# Patient Record
Sex: Female | Born: 1985 | Race: White | Hispanic: No | Marital: Single | Smoking: Never smoker
Health system: Southern US, Community
[De-identification: ages and names within clinical notes are randomized; demographics above are authoritative.]

## PROBLEM LIST (undated history)

## (undated) ENCOUNTER — Inpatient Hospital Stay (HOSPITAL_COMMUNITY): Payer: Self-pay

## (undated) DIAGNOSIS — N390 Urinary tract infection, site not specified: Secondary | ICD-10-CM

## (undated) DIAGNOSIS — Z8619 Personal history of other infectious and parasitic diseases: Secondary | ICD-10-CM

## (undated) DIAGNOSIS — IMO0002 Reserved for concepts with insufficient information to code with codable children: Secondary | ICD-10-CM

## (undated) HISTORY — PX: FOOT SURGERY: SHX648

## (undated) HISTORY — PX: LEEP: SHX91

## (undated) HISTORY — DX: Personal history of other infectious and parasitic diseases: Z86.19

## (undated) HISTORY — PX: COLPOSCOPY: SHX161

---

## 2004-01-22 ENCOUNTER — Other Ambulatory Visit: Admission: RE | Admit: 2004-01-22 | Discharge: 2004-01-22 | Payer: Self-pay | Admitting: Obstetrics and Gynecology

## 2004-10-17 ENCOUNTER — Other Ambulatory Visit: Admission: RE | Admit: 2004-10-17 | Discharge: 2004-10-17 | Payer: Self-pay | Admitting: Obstetrics and Gynecology

## 2005-10-20 ENCOUNTER — Other Ambulatory Visit: Admission: RE | Admit: 2005-10-20 | Discharge: 2005-10-20 | Payer: Self-pay | Admitting: Obstetrics and Gynecology

## 2007-09-16 ENCOUNTER — Ambulatory Visit (HOSPITAL_BASED_OUTPATIENT_CLINIC_OR_DEPARTMENT_OTHER): Admission: RE | Admit: 2007-09-16 | Discharge: 2007-09-16 | Payer: Self-pay | Admitting: Orthopedic Surgery

## 2010-12-24 NOTE — Op Note (Signed)
Victoria Bowman, Victoria Bowman NO.:  [id number]   MEDICAL RECORD NO.:  [id number]          PATIENT TYPE:  AMB   LOCATION:  DSC                          FACILITY:  [facility]   PHYSICIAN:  Loreta Ave, M.D. DATE OF BIRTH:  01-07-1986   DATE OF PROCEDURE:  09/16/2007  DATE OF DISCHARGE:                               OPERATIVE REPORT   PREOPERATIVE DIAGNOSIS:  Displaced shortness spiral fracture fifth  metacarpal left hand, closed.   POSTOPERATIVE DIAGNOSIS:  Displaced shortness spiral fracture fifth  metacarpal left hand, closed.   PROCEDURE:  Open reduction and internal fixation of fifth metacarpal  fracture with a five hole dorsal mini-fragment Synthes plate and 1.5 mm  screws.   SURGEON:  Loreta Ave, M.D.   ASSISTANT:  Genene Churn. Barry Dienes, P.A.-C.   ANESTHESIA:  General   BLOOD LOSS:  Minimal.   TOURNIQUET TIME:  35 minutes.   SPECIMENS:  None.   CULTURES:  None.   COMPLICATIONS:  None.   DRESSING:  Soft compressive and a splint.   PROCEDURE IN DETAIL:  The patient was brought to the operating room and  after adequate anesthesia had been obtained, the splint removed.  Tourniquet applied.  Prepped and draped in the usual sterile fashion.  Exsanguinated with elevation of Esmarch, tourniquet inflated to 250  mmHg.  A longitudinal incision over the dorsal ulnar aspect of the fifth  metacarpal.  Fluoroscopic guidance throughout.  Skin and subcutaneous  tissue divided.  Neurovascular structures and extensor tendons  retracted. Subperiosteal exposure of the fracture.  Debris cleared from  the fracture, then reduced anatomically, held with a clamp.  It was then  affixed with a dorsal five hole plate.  Two proximal and two distal  screws and then the middle screw through the plate and across the  fracture but overdrilled to produce lag effect across the spiral part of  the fracture.  At completion, anatomic alignment, solid stable fixation  confirmed visually  and fluoroscopically.  Some of the screw had a little  prominent bone, I tried to go to a slightly shorter screw, I could not  get the opposite cortex set.  Rotation normal. Length normal.  Wound  irrigated.  Closed with  nylon.  Margins of the wound injected with Marcaine.  Sterile  compressive dressing and ulnar gutter splint applied.  Tourniquet was  removed.  Anesthesia reversed.  Brought to the recovery room. Tolerated  the surgery well.  No complications.      Loreta Ave, M.D.  Electronically Signed     DFM/MEDQ  D:  09/16/2007  T:  09/16/2007  Job:  [id number]

## 2011-05-02 LAB — POCT HEMOGLOBIN-HEMACUE: Hemoglobin: 12.5

## 2012-06-28 LAB — OB RESULTS CONSOLE ABO/RH: RH Type: POSITIVE

## 2012-06-28 LAB — OB RESULTS CONSOLE ANTIBODY SCREEN: Antibody Screen: NEGATIVE

## 2012-06-28 LAB — OB RESULTS CONSOLE RPR: RPR: NONREACTIVE

## 2012-06-28 LAB — OB RESULTS CONSOLE GC/CHLAMYDIA: Chlamydia: NEGATIVE

## 2012-06-28 LAB — OB RESULTS CONSOLE RUBELLA ANTIBODY, IGM: Rubella: IMMUNE

## 2012-06-28 LAB — OB RESULTS CONSOLE HIV ANTIBODY (ROUTINE TESTING): HIV: NONREACTIVE

## 2012-06-28 LAB — OB RESULTS CONSOLE HEPATITIS B SURFACE ANTIGEN: Hepatitis B Surface Ag: NEGATIVE

## 2012-07-22 ENCOUNTER — Encounter (HOSPITAL_COMMUNITY): Payer: Self-pay

## 2012-07-22 ENCOUNTER — Inpatient Hospital Stay (HOSPITAL_COMMUNITY)
Admission: AD | Admit: 2012-07-22 | Discharge: 2012-07-22 | Disposition: A | Discharge status: Home / Self Care | Payer: BC Managed Care – PPO | Source: Ambulatory Visit | Attending: Obstetrics and Gynecology | Admitting: Obstetrics and Gynecology

## 2012-07-22 ENCOUNTER — Inpatient Hospital Stay (HOSPITAL_COMMUNITY): Payer: BC Managed Care – PPO

## 2012-07-22 DIAGNOSIS — O209 Hemorrhage in early pregnancy, unspecified: Secondary | ICD-10-CM

## 2012-07-22 HISTORY — DX: Urinary tract infection, site not specified: N39.0

## 2012-07-22 HISTORY — DX: Reserved for concepts with insufficient information to code with codable children: IMO0002

## 2012-07-22 LAB — CBC
HCT: 33 % — ABNORMAL LOW (ref 36.0–46.0)
Hemoglobin: 11.3 g/dL — ABNORMAL LOW (ref 12.0–15.0)
MCV: 88.9 fL (ref 78.0–100.0)
RBC: 3.71 MIL/uL — ABNORMAL LOW (ref 3.87–5.11)
RDW: 12.8 % (ref 11.5–15.5)
WBC: 9.6 10*3/uL (ref 4.0–10.5)

## 2012-07-22 NOTE — MAU Provider Note (Signed)
  History     CSN: [id number]  Arrival date and time: 07/22/12 1537   First Provider Initiated Contact with Patient 07/22/12 1611      Chief Complaint  Patient presents with  . Vaginal Bleeding   HPI  Pt is G1P0 [redacted]w[redacted]d pregnant who present with bright red bleeding in pregnancy.  Pt has a hx of early [facility] on earlier ultrasound in the office.  Pt denies cramping or other symptoms.  She denies clots or passage of tissue.  Past Medical History  Diagnosis Date  . UTI (lower urinary tract infection)   . Abnormal Pap smear     Past Surgical History  Procedure Date  . Foot surgery   . Colposcopy   . Leep     History reviewed. No pertinent family history.  History  Substance Use Topics  . Smoking status: Never Smoker   . Smokeless tobacco: Not on file  . Alcohol Use: No    Allergies: Allergies not on file  No prescriptions prior to admission    Review of Systems  Constitutional: Negative for fever and chills.  Gastrointestinal: Negative for nausea, vomiting, abdominal pain, diarrhea and constipation.  Genitourinary: Negative for dysuria.   Physical Exam   Blood pressure 108/70, pulse 83, temperature 97.2 F (36.2 C), temperature source Oral, resp. rate 16, height 5\' 2"  (1.575 m), weight 126 lb 8 oz (57.38 kg), last menstrual period 05/05/2012.  Physical Exam  Vitals reviewed. Constitutional: She is oriented to person, place, and time. She appears well-developed and well-nourished.  HENT:  Head: Normocephalic.  Eyes: Conjunctivae normal are normal. Pupils are equal, round, and reactive to light.  Neck: Normal range of motion. Neck supple.  Cardiovascular: Normal rate.   Respiratory: Effort normal.  GI: Soft. She exhibits no distension. There is no tenderness. There is no rebound and no guarding.       FHR noted with doppler  Genitourinary:       Mod amount of bright red blood from os- several small clots in vault; cervix closed; bimanual deferred    Musculoskeletal: Normal range of motion.  Neurological: She is alert and oriented to person, place, and time.  Skin: Skin is warm and dry.  Psychiatric: She has a normal mood and affect.    MAU Course  Procedures   Results for orders placed during the hospital encounter of 07/22/12 (from the past 24 hour(s))  CBC     Status: Abnormal   Collection Time   07/22/12  4:23 PM      Component Value Range   WBC 9.6  4.0 - 10.5 K/uL   RBC 3.71 (*) 3.87 - 5.11 MIL/uL   Hemoglobin 11.3 (*) 12.0 - 15.0 g/dL   HCT 08.6 (*) 57.8 - 46.9 %   MCV 88.9  78.0 - 100.0 fL   MCH 30.5  26.0 - 34.0 pg   MCHC 34.2  30.0 - 36.0 g/dL   RDW 62.9  52.8 - 41.3 %   Platelets 254  150 - 400 K/uL  ABO/RH     Status: Normal   Collection Time   07/22/12  4:23 PM      Component Value Range   ABO/RH(D) O POS     Discussed with Dr. Renaldo Fiddler Assessment and Plan  Bleeding in pregnancy Pelvic rest F/u with Dr. Gloriajean Dell 07/22/2012, 4:21 PM

## 2012-07-22 NOTE — MAU Note (Signed)
Pt states was seen at Dr. Lisbeth Ply office, was told spotting was normal, however today began bleeding like a menstrual cycle 30 minutes ago. Pt tearful in triage room. Taken directly to room

## 2012-07-22 NOTE — MAU Note (Signed)
Patient is in with c/o new onset of vaginal bleeding. She didn't have a pad. New pad was given and a streak of blood is on pad. She denies any pain. Patient states that early was done to confirm about 2 weeks ago.

## 2012-08-11 NOTE — L&D Delivery Note (Signed)
Delivery Note At 3:31 PM a viable female was delivered via Vaginal, Spontaneous Delivery (Presentation: Right Occiput Anterior).  APGAR: 8, 9; weight .   Placenta status: Intact, Spontaneous.  Cord: 3 vessels with the following complications: None.  Cord pH: notn sent   Tight nuchat cord X 1, clamped and cut  Anesthesia: Epidural  Episiotomy: Median Lacerations: 3rd degree;Perineal>>>partial>>>rectum intact  2-0 Vicryl fig 8 on sphincter mm and capsule   Suture Repair: 3.0 vicryl rapide Est. Blood Loss (mL)400:  Mom to postpartum.  Baby to nursery-stable.  Meriel Pica 01/31/2013, 3:53 PM

## 2012-10-16 ENCOUNTER — Encounter (HOSPITAL_COMMUNITY): Payer: Self-pay

## 2012-10-16 ENCOUNTER — Inpatient Hospital Stay (HOSPITAL_COMMUNITY)
Admission: AD | Admit: 2012-10-16 | Discharge: 2012-10-16 | Disposition: A | Discharge status: Home / Self Care | Payer: BC Managed Care – PPO | Source: Ambulatory Visit | Attending: Obstetrics and Gynecology | Admitting: Obstetrics and Gynecology

## 2012-10-16 DIAGNOSIS — M549 Dorsalgia, unspecified: Secondary | ICD-10-CM | POA: Insufficient documentation

## 2012-10-16 DIAGNOSIS — O99891 Other specified diseases and conditions complicating pregnancy: Secondary | ICD-10-CM | POA: Insufficient documentation

## 2012-10-16 LAB — URINALYSIS, ROUTINE W REFLEX MICROSCOPIC
Glucose, UA: NEGATIVE mg/dL
Hgb urine dipstick: NEGATIVE
Specific Gravity, Urine: 1.025 (ref 1.005–1.030)
pH: 6 (ref 5.0–8.0)

## 2012-10-16 LAB — URINE MICROSCOPIC-ADD ON

## 2012-10-16 MED ORDER — ACETAMINOPHEN 325 MG PO TABS
650.0000 mg | ORAL_TABLET | Freq: Once | ORAL | Status: AC
  Administered 2012-10-16: 650 mg via ORAL
  Filled 2012-10-16: qty 2

## 2012-10-16 MED ORDER — CEPHALEXIN 500 MG PO CAPS
500.0000 mg | ORAL_CAPSULE | Freq: Once | ORAL | Status: AC
  Administered 2012-10-16: 500 mg via ORAL
  Filled 2012-10-16: qty 1

## 2012-10-16 MED ORDER — CEPHALEXIN 500 MG PO CAPS: 500.0000 mg | ORAL_CAPSULE | Freq: Three times a day (TID) | ORAL | Status: DC

## 2012-10-16 NOTE — MAU Note (Signed)
Back pain all week but worse tonight. Pain woke me up. Started lower back and now R flank area. Consistent dull pain with occ sharp pain.

## 2012-10-16 NOTE — MAU Note (Signed)
Pt states having lower back for a couple weeks, worsening last pm.  States it has eased up at the present time. Back pain did radiate up R side of back. Denies uc pain, vag. Bleeding, or discharge.

## 2012-10-16 NOTE — MAU Provider Note (Signed)
History     CSN: [id number]  Arrival date and time: 10/16/12 0218   None     Chief Complaint  Patient presents with  . Back Pain   HPI Victoria Bowman is a 27 y.o. female @ [redacted]w[redacted]d gestation who presents to MAU with back pain. The pain started a couple weeks ago and has been off and on but tonight the pain woke her and was worse. The pain is located in the right flank area. She rates the pain as 6/10. She describes the pain as sharp that comes and goes. Nothing makes the pain better or worse. Denies UTI symptoms. Had nausea and vomiting 2 days ago but none since then except when pain gets bad fees nausea. The history was provided by the patient.  OB History   Grav Para Term Preterm Abortions TAB SAB Ect Mult Living   1 0 0 0 0 0 0 0 0 0       Past Medical History  Diagnosis Date  . UTI (lower urinary tract infection)   . Abnormal Pap smear     Past Surgical History  Procedure Laterality Date  . Foot surgery    . Colposcopy    . Leep      History reviewed. No pertinent family history.  History  Substance Use Topics  . Smoking status: Never Smoker   . Smokeless tobacco: Not on file  . Alcohol Use: No    Allergies:  Allergies  Allergen Reactions  . Sulfa Antibiotics Hives, Itching and Swelling    Prescriptions prior to admission  Medication Sig Dispense Refill  . Prenatal Vit-Fe Fumarate-FA (PRENATAL MULTIVITAMIN) TABS Take 1 tablet by mouth daily.        Review of Systems  Constitutional: Negative for fever, chills and weight loss.  HENT: Negative for ear pain, nosebleeds, congestion, sore throat and neck pain.   Eyes: Negative for blurred vision, double vision, photophobia and pain.  Respiratory: Negative for cough, shortness of breath and wheezing.   Cardiovascular: Negative for chest pain, palpitations and leg swelling.  Gastrointestinal: Positive for nausea. Negative for heartburn, vomiting, abdominal pain, diarrhea and constipation.  Genitourinary:  Negative for dysuria, urgency and frequency.  Musculoskeletal: Positive for back pain. Negative for myalgias.  Skin: Negative for itching and rash.  Neurological: Negative for dizziness, sensory change, speech change, seizures and headaches.  Endo/Heme/Allergies: Does not bruise/bleed easily.  Psychiatric/Behavioral: Negative for depression. The patient is not nervous/anxious.    Blood pressure 120/80, pulse 87, temperature 98.6 F (37 C), resp. rate 20, height 5' 3.5" (1.613 m), weight 135 lb 9.6 oz (61.508 kg), last menstrual period 05/05/2012.  Physical Exam  Nursing note and vitals reviewed. Constitutional: She is oriented to person, place, and time. She appears well-developed and well-nourished. No distress.  HENT:  Head: Normocephalic and atraumatic.  Eyes: EOM are normal.  Neck: Neck supple.  Cardiovascular: Normal rate and regular rhythm.   Respiratory: Effort normal and breath sounds normal. No respiratory distress.  GI: Soft. There is no tenderness.  Musculoskeletal: Normal range of motion.  Mildly tender with palpation right thoracic area.    Neurological: She is alert and oriented to person, place, and time.  Skin: Skin is warm and dry.  Psychiatric: She has a normal mood and affect. Her behavior is normal. Judgment and thought content normal.   EFM: baseline 150, no contractions Procedures Results for orders placed during the hospital encounter of 10/16/12 (from the past 24 hour(s))  URINALYSIS, ROUTINE W  REFLEX MICROSCOPIC     Status: Abnormal   Collection Time    10/16/12  2:30 AM      Result Value Range   Color, Urine YELLOW  YELLOW   APPearance CLEAR  CLEAR   Specific Gravity, Urine 1.025  1.005 - 1.030   pH 6.0  5.0 - 8.0   Glucose, UA NEGATIVE  NEGATIVE mg/dL   Hgb urine dipstick NEGATIVE  NEGATIVE   Bilirubin Urine NEGATIVE  NEGATIVE   Ketones, ur NEGATIVE  NEGATIVE mg/dL   Protein, ur NEGATIVE  NEGATIVE mg/dL   Urobilinogen, UA 0.2  0.0 - 1.0 mg/dL    Nitrite NEGATIVE  NEGATIVE   Leukocytes, UA TRACE (*) NEGATIVE  URINE MICROSCOPIC-ADD ON     Status: Abnormal   Collection Time    10/16/12  2:30 AM      Result Value Range   Squamous Epithelial / LPF RARE  RARE   WBC, UA 3-6  <3 WBC/hpf   RBC / HPF 3-6  <3 RBC/hpf   Bacteria, UA FEW (*) RARE    I discussed this case with Dr. Rana Snare  Assessment: 27 y.o. female @ [redacted]w[redacted]d with back pain   Possible early UTI  Plan:  Tylenol, Rx Keflex, culture urine, follow up in the office Discussed with the patient and all questioned fully answered. She willreturn if any problems arise.     Medication List    TAKE these medications       cephALEXin 500 MG capsule  Commonly known as:  KEFLEX  Take 1 capsule (500 mg total) by mouth 3 (three) times daily.     prenatal multivitamin Tabs  Take 1 tablet by mouth daily.        NEESE,HOPE, RN, FNP, [facility] 10/16/2012, 3:01 AM

## 2012-10-17 LAB — URINE CULTURE
Colony Count: NO GROWTH
Culture: NO GROWTH

## 2012-11-15 ENCOUNTER — Inpatient Hospital Stay (HOSPITAL_COMMUNITY)
Admission: AD | Admit: 2012-11-15 | Discharge: 2012-11-17 | DRG: 886 | Disposition: A | Discharge status: Home / Self Care | Payer: BC Managed Care – PPO | Source: Ambulatory Visit | Attending: Obstetrics and Gynecology | Admitting: Obstetrics and Gynecology

## 2012-11-15 ENCOUNTER — Encounter (HOSPITAL_COMMUNITY): Payer: Self-pay | Admitting: *Deleted

## 2012-11-15 ENCOUNTER — Inpatient Hospital Stay (HOSPITAL_COMMUNITY): Payer: BC Managed Care – PPO

## 2012-11-15 DIAGNOSIS — O26839 Pregnancy related renal disease, unspecified trimester: Secondary | ICD-10-CM | POA: Diagnosis present

## 2012-11-15 DIAGNOSIS — R1032 Left lower quadrant pain: Secondary | ICD-10-CM | POA: Diagnosis present

## 2012-11-15 DIAGNOSIS — N12 Tubulo-interstitial nephritis, not specified as acute or chronic: Secondary | ICD-10-CM | POA: Diagnosis present

## 2012-11-15 DIAGNOSIS — N1330 Unspecified hydronephrosis: Secondary | ICD-10-CM | POA: Diagnosis present

## 2012-11-15 DIAGNOSIS — O2390 Unspecified genitourinary tract infection in pregnancy, unspecified trimester: Principal | ICD-10-CM | POA: Diagnosis present

## 2012-11-15 DIAGNOSIS — N200 Calculus of kidney: Secondary | ICD-10-CM | POA: Diagnosis present

## 2012-11-15 LAB — COMPREHENSIVE METABOLIC PANEL
AST: 19 U/L (ref 0–37)
Albumin: 2.7 g/dL — ABNORMAL LOW (ref 3.5–5.2)
BUN: 16 mg/dL (ref 6–23)
Calcium: 9.3 mg/dL (ref 8.4–10.5)
Creatinine, Ser: 0.61 mg/dL (ref 0.50–1.10)
Total Protein: 7 g/dL (ref 6.0–8.3)

## 2012-11-15 LAB — URINALYSIS, ROUTINE W REFLEX MICROSCOPIC
Glucose, UA: NEGATIVE mg/dL
Ketones, ur: NEGATIVE mg/dL
Protein, ur: NEGATIVE mg/dL
Urobilinogen, UA: 0.2 mg/dL (ref 0.0–1.0)

## 2012-11-15 LAB — URINE MICROSCOPIC-ADD ON

## 2012-11-15 LAB — CBC
HCT: 33.6 % — ABNORMAL LOW (ref 36.0–46.0)
MCHC: 33 g/dL (ref 30.0–36.0)
MCV: 92.8 fL (ref 78.0–100.0)
Platelets: 269 10*3/uL (ref 150–400)
RDW: 13.2 % (ref 11.5–15.5)

## 2012-11-15 MED ORDER — BUTORPHANOL TARTRATE 1 MG/ML IJ SOLN
1.0000 mg | Freq: Once | INTRAMUSCULAR | Status: AC
  Administered 2012-11-15: 1 mg via INTRAVENOUS
  Filled 2012-11-15: qty 1

## 2012-11-15 MED ORDER — ZOLPIDEM TARTRATE 5 MG PO TABS: 5.0000 mg | ORAL_TABLET | Freq: Every evening | ORAL | Status: DC | PRN

## 2012-11-15 MED ORDER — CALCIUM CARBONATE ANTACID 500 MG PO CHEW
2.0000 | CHEWABLE_TABLET | ORAL | Status: DC | PRN
  Filled 2012-11-15: qty 2

## 2012-11-15 MED ORDER — ACETAMINOPHEN 325 MG PO TABS: 650.0000 mg | ORAL_TABLET | ORAL | Status: DC | PRN

## 2012-11-15 MED ORDER — LACTATED RINGERS IV SOLN
INTRAVENOUS | Status: DC
  Administered 2012-11-15 – 2012-11-17 (×6): via INTRAVENOUS

## 2012-11-15 MED ORDER — PROMETHAZINE HCL 25 MG/ML IJ SOLN
12.5000 mg | INTRAMUSCULAR | Status: DC | PRN
Start: 2012-11-15 — End: 2012-11-17
  Administered 2012-11-15: 12.5 mg via INTRAVENOUS
  Filled 2012-11-15: qty 1

## 2012-11-15 MED ORDER — LACTATED RINGERS IV BOLUS (SEPSIS)
1000.0000 mL | Freq: Once | INTRAVENOUS | Status: AC
  Administered 2012-11-15: 1000 mL via INTRAVENOUS

## 2012-11-15 MED ORDER — DOCUSATE SODIUM 100 MG PO CAPS
100.0000 mg | ORAL_CAPSULE | Freq: Every day | ORAL | Status: DC
  Administered 2012-11-15 – 2012-11-16 (×2): 100 mg via ORAL
  Filled 2012-11-15 (×2): qty 1

## 2012-11-15 MED ORDER — CEFAZOLIN SODIUM-DEXTROSE 2-3 GM-% IV SOLR
2.0000 g | Freq: Three times a day (TID) | INTRAVENOUS | Status: DC
  Administered 2012-11-15 – 2012-11-17 (×6): 2 g via INTRAVENOUS
  Filled 2012-11-15 (×7): qty 50

## 2012-11-15 MED ORDER — PRENATAL MULTIVITAMIN CH
1.0000 | ORAL_TABLET | Freq: Every day | ORAL | Status: DC
  Administered 2012-11-15 – 2012-11-16 (×2): 1 via ORAL
  Filled 2012-11-15 (×2): qty 1

## 2012-11-15 NOTE — MAU Provider Note (Signed)
History     CSN: [id number]  Arrival date and time: 11/15/12 4098   First Provider Initiated Contact with Patient 11/15/12 0920      Chief Complaint  Patient presents with  . Abdominal Pain  . Flank Pain   HPI Ms. Victoria Bowman is a 27 y.o. G1P0000 at [redacted]w[redacted]d who present to MAU today with abdominal pain, flank pain and emesis. Pain started yesterday afternoon. Emesis since earlier this morning. She is also having some LLQ pain. She denies dysuria, but is having frequency and urgency. She took tylenol just prior to coming in. No relief yet. She denies vaginal bleeding, abnormal discharge, contractions or fever. She reports good fetal movement.   OB History   Grav Para Term Preterm Abortions TAB SAB Ect Mult Living   1 0 0 0 0 0 0 0 0 0       Past Medical History  Diagnosis Date  . UTI (lower urinary tract infection)   . Abnormal Pap smear     Past Surgical History  Procedure Laterality Date  . Foot surgery    . Colposcopy    . Leep      Family History  Problem Relation Age of Onset  . Kidney Stones Mother     History  Substance Use Topics  . Smoking status: Never Smoker   . Smokeless tobacco: Not on file  . Alcohol Use: No    Allergies:  Allergies  Allergen Reactions  . Sulfa Antibiotics Hives, Itching and Swelling    Prescriptions prior to admission  Medication Sig Dispense Refill  . Prenatal Vit-Fe Fumarate-FA (PRENATAL MULTIVITAMIN) TABS Take 1 tablet by mouth daily.        Review of Systems  Constitutional: Negative for fever.  Gastrointestinal: Positive for nausea, vomiting and abdominal pain. Negative for diarrhea and constipation.  Genitourinary: Positive for urgency, frequency, hematuria and flank pain. Negative for dysuria.       Neg - vaginal bleeding, abnormal discharge, LOF  Neurological: Negative for dizziness and loss of consciousness.   Physical Exam   Blood pressure 120/80, pulse 77, temperature 94 F (34.4 C), temperature source  Axillary, height 5\' 3"  (1.6 m), weight 138 lb 9.6 oz (62.869 kg), last menstrual period 05/05/2012.  Physical Exam  Constitutional: She is oriented to person, place, and time. She appears well-developed and well-nourished. No distress.  HENT:  Head: Normocephalic and atraumatic.  Cardiovascular: Normal rate, regular rhythm and normal heart sounds.   Respiratory: Effort normal and breath sounds normal. No respiratory distress.  GI: Soft. Bowel sounds are normal. She exhibits no distension and no mass. There is no tenderness. There is no rebound, no guarding and no CVA tenderness.  Neurological: She is alert and oriented to person, place, and time.  Skin: Skin is warm. No erythema.  Psychiatric: She has a normal mood and affect.   Results for orders placed during the hospital encounter of 11/15/12 (from the past 24 hour(s))  URINALYSIS, ROUTINE W REFLEX MICROSCOPIC     Status: Abnormal   Collection Time    11/15/12  8:30 AM      Result Value Range   Color, Urine YELLOW  YELLOW   APPearance CLOUDY (*) CLEAR   Specific Gravity, Urine 1.025  1.005 - 1.030   pH 6.5  5.0 - 8.0   Glucose, UA NEGATIVE  NEGATIVE mg/dL   Hgb urine dipstick LARGE (*) NEGATIVE   Bilirubin Urine NEGATIVE  NEGATIVE   Ketones, ur NEGATIVE  NEGATIVE mg/dL  Protein, ur NEGATIVE  NEGATIVE mg/dL   Urobilinogen, UA 0.2  0.0 - 1.0 mg/dL   Nitrite NEGATIVE  NEGATIVE   Leukocytes, UA SMALL (*) NEGATIVE  URINE MICROSCOPIC-ADD ON     Status: Abnormal   Collection Time    11/15/12  8:30 AM      Result Value Range   Squamous Epithelial / LPF FEW (*) RARE   WBC, UA 0-2  <3 WBC/hpf   RBC / HPF 21-50  <3 RBC/hpf   Bacteria, UA FEW (*) RARE   Urine-Other MUCOUS PRESENT    CBC     Status: Abnormal   Collection Time    11/15/12  9:45 AM      Result Value Range   WBC 18.4 (*) 4.0 - 10.5 K/uL   RBC 3.62 (*) 3.87 - 5.11 MIL/uL   Hemoglobin 11.1 (*) 12.0 - 15.0 g/dL   HCT 40.9 (*) 81.1 - 91.4 %   MCV 92.8  78.0 - 100.0 fL    MCH 30.7  26.0 - 34.0 pg   MCHC 33.0  30.0 - 36.0 g/dL   RDW 78.2  95.6 - 21.3 %   Platelets 269  150 - 400 K/uL  COMPREHENSIVE METABOLIC PANEL     Status: Abnormal   Collection Time    11/15/12  9:45 AM      Result Value Range   Sodium 134 (*) 135 - 145 mEq/L   Potassium 3.7  3.5 - 5.1 mEq/L   Chloride 100  96 - 112 mEq/L   CO2 21  19 - 32 mEq/L   Glucose, Bld 95  70 - 99 mg/dL   BUN 16  6 - 23 mg/dL   Creatinine, Ser 0.86  0.50 - 1.10 mg/dL   Calcium 9.3  8.4 - 57.8 mg/dL   Total Protein 7.0  6.0 - 8.3 g/dL   Albumin 2.7 (*) 3.5 - 5.2 g/dL   AST 19  0 - 37 U/L   ALT 15  0 - 35 U/L   Alkaline Phosphatase 155 (*) 39 - 117 U/L   Total Bilirubin 0.2 (*) 0.3 - 1.2 mg/dL   GFR calc non Af Amer >90  >90 mL/min   GFR calc Af Amer >90  >90 mL/min   US Transvaginal Non-ob  11/15/2012  *RADIOLOGY REPORT*  Clinical Data: Flank pain, hematuria, elevated white blood cell count, [redacted] weeks pregnant.  RENAL/URINARY TRACT ULTRASOUND COMPLETE AND LIMITED TRANSVAGINAL PELVIC ULTRASOUND  Comparison:  None  Findings:  Right Kidney:  13.4 cm.  Moderate hydronephrosis.  No stone identified.  Proximal/mid ureter not visualized.  Left Kidney:  13.3 cm.  Moderate to severe hydronephrosis.  No stone identified.  Proximal/mid ureter not visualized.  Bladder:  Bilateral ureteral jets visualized.  Normal.  The distal ureters at the ureterovesicular junction are mildly full and visualized at transvaginal imaging but no filling defect or calculus is otherwise identified.  IMPRESSION: Bilateral, left greater than right, hydronephrosis, out of that expected at this gestational age.  Although bilateral ureteral jets were visualized, if there is persistent question of stone or other abnormality, consider MR urography.  Findings discussed with Dr. Vincente Poli by Dr. Chilton Si at the time of imaging.   Original Report Authenticated By: Christiana Pellant, M.D.    US Renal  11/15/2012  *RADIOLOGY REPORT*  Clinical Data: Flank pain,  hematuria, elevated white blood cell count, [redacted] weeks pregnant.  RENAL/URINARY TRACT ULTRASOUND COMPLETE AND LIMITED TRANSVAGINAL PELVIC ULTRASOUND  Comparison:  None  Findings:  Right Kidney:  13.4 cm.  Moderate hydronephrosis.  No stone identified.  Proximal/mid ureter not visualized.  Left Kidney:  13.3 cm.  Moderate to severe hydronephrosis.  No stone identified.  Proximal/mid ureter not visualized.  Bladder:  Bilateral ureteral jets visualized.  Normal.  The distal ureters at the ureterovesicular junction are mildly full and visualized at transvaginal imaging but no filling defect or calculus is otherwise identified.  IMPRESSION: Bilateral, left greater than right, hydronephrosis, out of that expected at this gestational age.  Although bilateral ureteral jets were visualized, if there is persistent question of stone or other abnormality, consider MR urography.  Findings discussed with Dr. Vincente Poli by Dr. Chilton Si at the time of imaging.   Original Report Authenticated By: Christiana Pellant, M.D.     MAU Course  Procedures None  MDM Discussed patient with Dr. Thana Ates. She would like 1 L LR IV with 1 mg stadol for pain. Renal US today and CBC, CMP and Urine culture.  Patient reports significant improvement in pain. No pain at time of return from [place].  Dr. Thana Ates to admit patient for ?pyelonephritis based on CBC, UA and [place] results. She is here to see the patient. Assessment and Plan  A: Pyelonephritis  P: Admit patient to Antenatal Dr. Thana Ates to complete orders in Epic RN to transfer patient when bed request is approved  Victoria Starr, PA-C  11/15/2012, 11:59 AM

## 2012-11-15 NOTE — MAU Note (Signed)
Patient states she started having left flank and left abdominal pain yesterday. Has a heavy vaginal discharge, no leaking or bleeding and reports good fetal movement.

## 2012-11-15 NOTE — Progress Notes (Signed)
27 year old G 1 P 0 at 13 w 3 days evaluated by NP with left flank pain and nausea.  Exam remarkable for moderate Left CVAT UA +  Urine culture pending WBC is 18.4  Renal ultrasound - bilateral ureteral jets noted , hydro L > R  IMPRESSION: 28 w 3 days Pyelonephritis Cannot rule out kidney stone  PLAN: Admit  Ancef Urine culture pending Repeat CBC in am

## 2012-11-16 LAB — URINE CULTURE: Colony Count: NO GROWTH

## 2012-11-16 NOTE — Progress Notes (Signed)
Larence Penning, RN at bedside to do fetal non stress test.

## 2012-11-16 NOTE — Progress Notes (Signed)
S: No complaints, L flank improved. Able to tolerate regular diet. + FM. Denies contractions VB or leakage of fluid  O: VSS, afebrile Abd soft , gravid uterus Significant improved L flank pain  UC pending Continues on IV ATB's ( 24 hrs @ 1 :30 pm today) A: pyelonephritis 28 3/7 weeks IUP  P : continue abx for 24 hours.  Clinically doing well. FU culture.  Consider dc in am on oral abx

## 2012-11-17 MED ORDER — CEPHALEXIN 500 MG PO CAPS: 500.0000 mg | ORAL_CAPSULE | Freq: Four times a day (QID) | ORAL | Status: DC

## 2012-11-17 NOTE — Progress Notes (Signed)
[redacted]w[redacted]d  S//  Feels much better  O// BP 99/64  Pulse 82  Temp(Src) 97.9 F (36.6 C) (Oral)  Resp 16  Ht 5\' 3"  (1.6 m)  Wt 64.297 kg (141 lb 12 oz)  BMI 25.12 kg/m2  SpO2 98%  LMP 05/05/2012 Urine C and S>>NEG  Stable FHR  A+P// stone, improved>>D/C today

## 2012-11-17 NOTE — Discharge Summary (Signed)
Obstetric Discharge Summary Reason for Admission: flank pain Prenatal Procedures: none Intrapartum Procedures: IV ABX Postpartum Procedures: none Complications-Operative and Postpartum: urinary infection Hemoglobin  Date Value Range Status  11/15/2012 11.1* 12.0 - 15.0 g/dL Final     HCT  Date Value Range Status  11/15/2012 33.6* 36.0 - 46.0 % Final    Physical Exam:  General: alert Lochia: appropriate Uterine Fundus: soft Incision: NA DVT Evaluation: No evidence of DVT seen on physical exam.  Discharge Diagnoses: pregnancy, flank pain, stone, poss pyelo  Discharge Information: Date: 11/17/2012 Activity: unrestricted Diet: routine Medications: PNV and PO Keflex Condition: stable Instructions: refer to practice specific booklet Discharge to: home Follow-up Information   Follow up with Meriel Pica, MD. (has appt next Tues)    Contact information:   [address] [place] [address] [phone]       Newborn Data: This patient has no babies on file. Home with undel.  Meriel Pica 11/17/2012, 7:40 AM

## 2012-11-17 NOTE — Progress Notes (Signed)
Discharge instructions reviewed with patient.  Patient states understanding of home care, signs/symptoms to report to MD, medication, activity and return MD office visit.  No home equipment needed.  Patient ambulated in stable condition with staff without incident and discharged from hospital.

## 2013-01-24 ENCOUNTER — Inpatient Hospital Stay (HOSPITAL_COMMUNITY)
Admission: AD | Admit: 2013-01-24 | Discharge: 2013-01-24 | Disposition: A | Discharge status: Home / Self Care | Payer: BC Managed Care – PPO | Source: Ambulatory Visit | Attending: Obstetrics and Gynecology | Admitting: Obstetrics and Gynecology

## 2013-01-24 ENCOUNTER — Encounter (HOSPITAL_COMMUNITY): Payer: Self-pay | Admitting: *Deleted

## 2013-01-24 DIAGNOSIS — O99891 Other specified diseases and conditions complicating pregnancy: Secondary | ICD-10-CM | POA: Insufficient documentation

## 2013-01-24 LAB — WET PREP, GENITAL: Clue Cells Wet Prep HPF POC: NONE SEEN

## 2013-01-24 NOTE — MAU Note (Signed)
Pt states that between 1500 and 1530 she started leaking a yellowish tinted fluid-not wearing a pad-denies leaking at present

## 2013-01-28 ENCOUNTER — Encounter (HOSPITAL_COMMUNITY): Payer: Self-pay | Admitting: *Deleted

## 2013-01-28 ENCOUNTER — Telehealth (HOSPITAL_COMMUNITY): Payer: Self-pay | Admitting: *Deleted

## 2013-01-28 NOTE — Telephone Encounter (Signed)
Preadmission screen  

## 2013-01-31 ENCOUNTER — Encounter (HOSPITAL_COMMUNITY): Payer: Self-pay

## 2013-01-31 ENCOUNTER — Inpatient Hospital Stay (HOSPITAL_COMMUNITY): Payer: BC Managed Care – PPO | Admitting: Anesthesiology

## 2013-01-31 ENCOUNTER — Encounter (HOSPITAL_COMMUNITY): Payer: Self-pay | Admitting: Anesthesiology

## 2013-01-31 ENCOUNTER — Inpatient Hospital Stay (HOSPITAL_COMMUNITY)
Admission: AD | Admit: 2013-01-31 | Discharge: 2013-02-02 | DRG: 373 | Disposition: A | Discharge status: Home / Self Care | Payer: BC Managed Care – PPO | Source: Ambulatory Visit | Attending: Obstetrics and Gynecology | Admitting: Obstetrics and Gynecology

## 2013-01-31 LAB — COMPREHENSIVE METABOLIC PANEL
Albumin: 2.2 g/dL — ABNORMAL LOW (ref 3.5–5.2)
Alkaline Phosphatase: 362 U/L — ABNORMAL HIGH (ref 39–117)
BUN: 17 mg/dL (ref 6–23)
Chloride: 104 mEq/L (ref 96–112)
Creatinine, Ser: 0.62 mg/dL (ref 0.50–1.10)
GFR calc Af Amer: >90 mL/min (ref >90)
Glucose, Bld: 105 mg/dL — ABNORMAL HIGH (ref 70–99)
Total Bilirubin: 0.1 mg/dL — ABNORMAL LOW (ref 0.3–1.2)

## 2013-01-31 LAB — URINALYSIS, DIPSTICK ONLY
Glucose, UA: NEGATIVE mg/dL
Ketones, ur: 15 mg/dL — AB
Leukocytes, UA: NEGATIVE
Protein, ur: NEGATIVE mg/dL

## 2013-01-31 LAB — CBC
MCHC: 34 g/dL (ref 30.0–36.0)
MCV: 90.3 fL (ref 78.0–100.0)
Platelets: 279 10*3/uL (ref 150–400)
RDW: 14.4 % (ref 11.5–15.5)
WBC: 14 10*3/uL — ABNORMAL HIGH (ref 4.0–10.5)

## 2013-01-31 MED ORDER — IBUPROFEN 600 MG PO TABS: 600.0000 mg | ORAL_TABLET | Freq: Four times a day (QID) | ORAL | Status: DC | PRN

## 2013-01-31 MED ORDER — ONDANSETRON HCL 4 MG/2ML IJ SOLN: 4.0000 mg | Freq: Four times a day (QID) | INTRAMUSCULAR | Status: DC | PRN

## 2013-01-31 MED ORDER — TERBUTALINE SULFATE 1 MG/ML IJ SOLN: 0.2500 mg | Freq: Once | INTRAMUSCULAR | Status: DC | PRN

## 2013-01-31 MED ORDER — ONDANSETRON HCL 4 MG/2ML IJ SOLN: 4.0000 mg | INTRAMUSCULAR | Status: DC | PRN

## 2013-01-31 MED ORDER — BISACODYL 10 MG RE SUPP: 10.0000 mg | Freq: Every day | RECTAL | Status: DC | PRN

## 2013-01-31 MED ORDER — IBUPROFEN 800 MG PO TABS
800.0000 mg | ORAL_TABLET | Freq: Three times a day (TID) | ORAL | Status: DC | PRN
  Administered 2013-02-01 – 2013-02-02 (×3): 800 mg via ORAL
  Filled 2013-01-31 (×3): qty 1

## 2013-01-31 MED ORDER — LANOLIN HYDROUS EX OINT: TOPICAL_OINTMENT | CUTANEOUS | Status: DC | PRN

## 2013-01-31 MED ORDER — OXYCODONE-ACETAMINOPHEN 5-325 MG PO TABS: 1.0000 | ORAL_TABLET | Freq: Four times a day (QID) | ORAL | Status: DC | PRN

## 2013-01-31 MED ORDER — LIDOCAINE HCL (PF) 1 % IJ SOLN
30.0000 mL | INTRAMUSCULAR | Status: DC | PRN
  Administered 2013-01-31: 30 mL via SUBCUTANEOUS
  Filled 2013-01-31 (×2): qty 30

## 2013-01-31 MED ORDER — PRENATAL MULTIVITAMIN CH
1.0000 | ORAL_TABLET | Freq: Every day | ORAL | Status: DC
  Administered 2013-02-01: 1 via ORAL
  Filled 2013-01-31: qty 1

## 2013-01-31 MED ORDER — CITRIC ACID-SODIUM CITRATE 334-500 MG/5ML PO SOLN: 30.0000 mL | ORAL | Status: DC | PRN

## 2013-01-31 MED ORDER — OXYCODONE-ACETAMINOPHEN 5-325 MG PO TABS: 1.0000 | ORAL_TABLET | ORAL | Status: DC | PRN

## 2013-01-31 MED ORDER — FENTANYL 2.5 MCG/ML BUPIVACAINE 1/10 % EPIDURAL INFUSION (WH - ANES)
INTRAMUSCULAR | Status: DC | PRN
  Administered 2013-01-31: 14 mL/h via EPIDURAL

## 2013-01-31 MED ORDER — LACTATED RINGERS IV SOLN
INTRAVENOUS | Status: DC
  Administered 2013-01-31 (×4): via INTRAVENOUS

## 2013-01-31 MED ORDER — WITCH HAZEL-GLYCERIN EX PADS: 1.0000 "application " | MEDICATED_PAD | CUTANEOUS | Status: DC | PRN

## 2013-01-31 MED ORDER — DIPHENHYDRAMINE HCL 50 MG/ML IJ SOLN: 12.5000 mg | INTRAMUSCULAR | Status: DC | PRN

## 2013-01-31 MED ORDER — LACTATED RINGERS IV BOLUS (SEPSIS)
300.0000 mL | Freq: Once | INTRAVENOUS | Status: AC
  Administered 2013-01-31: 300 mL via INTRAVENOUS

## 2013-01-31 MED ORDER — MAGNESIUM SULFATE 40 MG/ML IJ SOLN: 2.0000 g | Freq: Once | INTRAMUSCULAR | Status: DC

## 2013-01-31 MED ORDER — ACETAMINOPHEN 325 MG PO TABS: 650.0000 mg | ORAL_TABLET | ORAL | Status: DC | PRN

## 2013-01-31 MED ORDER — TETANUS-DIPHTH-ACELL PERTUSSIS 5-2.5-18.5 LF-MCG/0.5 IM SUSP
0.5000 mL | Freq: Once | INTRAMUSCULAR | Status: DC
  Filled 2013-01-31: qty 0.5

## 2013-01-31 MED ORDER — SIMETHICONE 80 MG PO CHEW: 80.0000 mg | CHEWABLE_TABLET | ORAL | Status: DC | PRN

## 2013-01-31 MED ORDER — MEASLES, MUMPS & RUBELLA VAC ~~LOC~~ INJ
0.5000 mL | INJECTION | Freq: Once | SUBCUTANEOUS | Status: DC
  Filled 2013-01-31: qty 0.5

## 2013-01-31 MED ORDER — OXYTOCIN BOLUS FROM INFUSION: 500.0000 mL | INTRAVENOUS | Status: DC

## 2013-01-31 MED ORDER — OXYTOCIN 40 UNITS IN LACTATED RINGERS INFUSION - SIMPLE MED
62.5000 mL/h | INTRAVENOUS | Status: DC
  Filled 2013-01-31: qty 1000

## 2013-01-31 MED ORDER — PHENYLEPHRINE 40 MCG/ML (10ML) SYRINGE FOR IV PUSH (FOR BLOOD PRESSURE SUPPORT): 80.0000 ug | PREFILLED_SYRINGE | INTRAVENOUS | Status: DC | PRN

## 2013-01-31 MED ORDER — ZOLPIDEM TARTRATE 5 MG PO TABS: 5.0000 mg | ORAL_TABLET | Freq: Every evening | ORAL | Status: DC | PRN

## 2013-01-31 MED ORDER — LACTATED RINGERS IV SOLN: 500.0000 mL | INTRAVENOUS | Status: DC | PRN

## 2013-01-31 MED ORDER — PHENYLEPHRINE 40 MCG/ML (10ML) SYRINGE FOR IV PUSH (FOR BLOOD PRESSURE SUPPORT)
80.0000 ug | PREFILLED_SYRINGE | INTRAVENOUS | Status: DC | PRN
  Filled 2013-01-31 (×2): qty 5

## 2013-01-31 MED ORDER — DIPHENHYDRAMINE HCL 25 MG PO CAPS: 25.0000 mg | ORAL_CAPSULE | Freq: Four times a day (QID) | ORAL | Status: DC | PRN

## 2013-01-31 MED ORDER — FENTANYL 2.5 MCG/ML BUPIVACAINE 1/10 % EPIDURAL INFUSION (WH - ANES)
14.0000 mL/h | INTRAMUSCULAR | Status: DC | PRN
  Administered 2013-01-31: 14 mL/h via EPIDURAL
  Filled 2013-01-31 (×2): qty 125

## 2013-01-31 MED ORDER — SENNOSIDES-DOCUSATE SODIUM 8.6-50 MG PO TABS
2.0000 | ORAL_TABLET | Freq: Every day | ORAL | Status: DC
  Administered 2013-01-31 – 2013-02-01 (×2): 2 via ORAL

## 2013-01-31 MED ORDER — EPHEDRINE 5 MG/ML INJ
10.0000 mg | INTRAVENOUS | Status: DC | PRN
  Filled 2013-01-31 (×2): qty 4

## 2013-01-31 MED ORDER — OXYTOCIN 40 UNITS IN LACTATED RINGERS INFUSION - SIMPLE MED
1.0000 m[IU]/min | INTRAVENOUS | Status: DC
  Administered 2013-01-31: 2 m[IU]/min via INTRAVENOUS
  Administered 2013-01-31: 1 m[IU]/min via INTRAVENOUS

## 2013-01-31 MED ORDER — MAGNESIUM SULFATE BOLUS VIA INFUSION
4.0000 g | Freq: Once | INTRAVENOUS | Status: AC
  Administered 2013-01-31: 4 g via INTRAVENOUS
  Filled 2013-01-31: qty 500

## 2013-01-31 MED ORDER — EPHEDRINE 5 MG/ML INJ: 10.0000 mg | INTRAVENOUS | Status: DC | PRN

## 2013-01-31 MED ORDER — LACTATED RINGERS IV SOLN: 500.0000 mL | Freq: Once | INTRAVENOUS | Status: DC

## 2013-01-31 MED ORDER — SODIUM BICARBONATE 8.4 % IV SOLN
INTRAVENOUS | Status: DC | PRN
  Administered 2013-01-31: 4 mL via EPIDURAL

## 2013-01-31 MED ORDER — ONDANSETRON HCL 4 MG PO TABS: 4.0000 mg | ORAL_TABLET | ORAL | Status: DC | PRN

## 2013-01-31 MED ORDER — FLEET ENEMA 7-19 GM/118ML RE ENEM: 1.0000 | ENEMA | Freq: Every day | RECTAL | Status: DC | PRN

## 2013-01-31 MED ORDER — DIBUCAINE 1 % RE OINT: 1.0000 "application " | TOPICAL_OINTMENT | RECTAL | Status: DC | PRN

## 2013-01-31 MED ORDER — MAGNESIUM SULFATE 40 G IN LACTATED RINGERS - SIMPLE
2.0000 g/h | INTRAVENOUS | Status: DC
  Filled 2013-01-31 (×2): qty 500

## 2013-01-31 MED ORDER — LACTATED RINGERS IV SOLN
INTRAVENOUS | Status: DC
  Administered 2013-01-31 – 2013-02-01 (×2): via INTRAVENOUS

## 2013-01-31 MED ORDER — LIDOCAINE HCL (PF) 1 % IJ SOLN
INTRAMUSCULAR | Status: DC | PRN
  Administered 2013-01-31 (×4): 4 mL

## 2013-01-31 MED ORDER — BENZOCAINE-MENTHOL 20-0.5 % EX AERO
1.0000 "application " | INHALATION_SPRAY | CUTANEOUS | Status: DC | PRN
  Filled 2013-01-31: qty 56

## 2013-01-31 NOTE — Anesthesia Preprocedure Evaluation (Signed)

## 2013-01-31 NOTE — Anesthesia Procedure Notes (Addendum)
Epidural Patient location during procedure: OB Start time: 01/31/2013 8:04 AM  Staffing Anesthesiologist: Surie Suchocki A. Performed by: anesthesiologist   Preanesthetic Checklist Completed: patient identified, site marked, surgical consent, pre-op evaluation, timeout performed, IV checked, risks and benefits discussed and monitors and equipment checked  Epidural Patient position: sitting Prep: site prepped and draped and DuraPrep Patient monitoring: continuous pulse ox and blood pressure Approach: midline Injection technique: LOR air  Needle:  Needle type: Tuohy  Needle gauge: 17 G Needle length: 9 cm and 9 Needle insertion depth: 4 cm Catheter type: closed end flexible Catheter size: 19 Gauge Catheter at skin depth: 8 cm Test dose: negative and Other  Assessment Events: blood not aspirated, injection not painful, no injection resistance, negative IV test and no paresthesia  Additional Notes Patient identified. Risks and benefits discussed including failed block, incomplete  Pain control, post dural puncture headache, nerve damage, paralysis, blood pressure Changes, nausea, vomiting, reactions to medications-both toxic and allergic and post Partum back pain. All questions were answered. Patient expressed understanding and wished to proceed. Sterile technique was used throughout procedure. Epidural site was Dressed with sterile barrier dressing. No paresthesias, signs of intravascular injection Or signs of intrathecal spread were encountered.  Patient was more comfortable after the epidural was dosed. Please see RN's note for documentation of vital signs and FHR which are stable.   Epidural Patient location during procedure: OB Start time: 01/31/2013 11:41 AM  Staffing Anesthesiologist: Sheena Donegan A. Performed by: anesthesiologist   Preanesthetic Checklist Completed: patient identified, site marked, surgical consent, pre-op evaluation, timeout performed, IV  checked, risks and benefits discussed and monitors and equipment checked  Epidural Patient position: sitting Prep: site prepped and draped and DuraPrep Patient monitoring: continuous pulse ox and blood pressure Approach: midline Injection technique: LOR air  Needle:  Needle type: Tuohy  Needle gauge: 17 G Needle length: 9 cm and 9 Needle insertion depth: 4 cm Catheter type: closed end flexible Catheter size: 19 Gauge Catheter at skin depth: 9 cm Test dose: negative and Other  Assessment Events: blood not aspirated, injection not painful, no injection resistance, negative IV test and no paresthesia  Additional Notes Patient identified. Risks and benefits discussed including failed block, incomplete  Pain control, post dural puncture headache, nerve damage, paralysis, blood pressure Changes, nausea, vomiting, reactions to medications-both toxic and allergic and post Partum back pain. All questions were answered. Patient expressed understanding and wished to proceed. Sterile technique was used throughout procedure. Epidural site was Dressed with sterile barrier dressing. No paresthesias, signs of intravascular injection Or signs of intrathecal spread were encountered.  Patient was more comfortable after the epidural was dosed. Please see RN's note for documentation of vital signs and FHR which are stable.

## 2013-01-31 NOTE — MAU Note (Signed)
ROM at midnight, some contractions

## 2013-01-31 NOTE — H&P (Signed)
Victoria Bowman is a 27 y.o. female presenting for labor. Maternal Medical History:  Reason for admission: Contractions.   Contractions: Onset was 3-5 hours ago.   Frequency: irregular.   Perceived severity is moderate.    Fetal activity: Perceived fetal activity is normal.   Last perceived fetal movement was within the past hour.      OB History   Grav Para Term Preterm Abortions TAB SAB Ect Mult Living   1 0 0 0 0 0 0 0 0 0      Past Medical History  Diagnosis Date  . UTI (lower urinary tract infection)   . Abnormal Pap smear   . Hx of varicella    Past Surgical History  Procedure Laterality Date  . Foot surgery    . Colposcopy    . Leep     Family History: family history includes Cancer in her cousin, maternal grandfather, maternal grandmother, paternal aunt, and paternal uncle; Diabetes in her cousin; and Kidney Stones in her mother. Social History:  reports that she has never smoked. She does not have any smokeless tobacco history on file. She reports that she does not drink alcohol or use illicit drugs.   Prenatal Transfer Tool  Maternal Diabetes: No Genetic Screening: Normal Maternal Ultrasounds/Referrals: Normal Fetal Ultrasounds or other Referrals:  None Maternal Substance Abuse:  No Significant Maternal Medications:  None Significant Maternal Lab Results:  None Other Comments:  None  ROS  Dilation: 5 Effacement (%): 70 Station: -1 Exam by:: Dr. Marcelle Overlie Blood pressure 144/94, pulse 83, temperature 97.4 F (36.3 C), temperature source Oral, resp. rate 18, height 5\' 2"  (1.575 m), weight 150 lb (68.04 kg), last menstrual period 05/05/2012, SpO2 98.00%. Maternal Exam:  Uterine Assessment: Contraction strength is moderate.  Contraction frequency is irregular.   Abdomen: Patient reports no abdominal tenderness. Fundal height is term FH, FHR 142.   Estimated fetal weight is AGA.   Fetal presentation: vertex  Introitus: Normal vulva. Normal vagina.  Pelvis:  adequate for delivery.   Cervix: Cervix evaluated by digital exam.     Physical Exam  Constitutional: She is oriented to person, place, and time. She appears well-developed and well-nourished.  HENT:  Head: Normocephalic and atraumatic.  Neck: Normal range of motion. Neck supple.  Cardiovascular: Normal rate and regular rhythm.   Respiratory: Effort normal and breath sounds normal.  GI:  Term FH  Genitourinary:  5/C/-1, clear AF  Musculoskeletal: Normal range of motion.  Neurological: She is alert and oriented to person, place, and time.    Prenatal labs: ABO, Rh: --/--/O POS (04/07 0945) Antibody: Negative (11/18 0000) Rubella: Immune (11/18 0000) RPR: Nonreactive (11/18 0000)  HBsAg: Negative (11/18 0000)  HIV: Non-reactive (11/18 0000)  GBS: Negative (06/20 0000)   Assessment/Plan: Term IUP, labor   Victoria Bowman M 01/31/2013, 8:19 AM

## 2013-02-01 ENCOUNTER — Inpatient Hospital Stay (HOSPITAL_COMMUNITY): Admission: RE | Admit: 2013-02-01 | Payer: BC Managed Care – PPO | Source: Ambulatory Visit

## 2013-02-01 LAB — COMPREHENSIVE METABOLIC PANEL
AST: 46 U/L — ABNORMAL HIGH (ref 0–37)
CO2: 24 mEq/L (ref 19–32)
Calcium: 7 mg/dL — ABNORMAL LOW (ref 8.4–10.5)
Creatinine, Ser: 0.62 mg/dL (ref 0.50–1.10)
GFR calc Af Amer: >90 mL/min (ref >90)
GFR calc non Af Amer: >90 mL/min (ref >90)
Glucose, Bld: 99 mg/dL (ref 70–99)

## 2013-02-01 LAB — CBC
Hemoglobin: 8.5 g/dL — ABNORMAL LOW (ref 12.0–15.0)
RBC: 2.77 MIL/uL — ABNORMAL LOW (ref 3.87–5.11)
WBC: 14.8 10*3/uL — ABNORMAL HIGH (ref 4.0–10.5)

## 2013-02-01 MED ORDER — SODIUM CHLORIDE 0.9 % IJ SOLN
3.0000 mL | Freq: Two times a day (BID) | INTRAMUSCULAR | Status: DC
  Administered 2013-02-01: 3 mL via INTRAVENOUS

## 2013-02-01 MED ORDER — SODIUM CHLORIDE 0.9 % IJ SOLN: 3.0000 mL | INTRAMUSCULAR | Status: DC | PRN

## 2013-02-01 NOTE — Anesthesia Postprocedure Evaluation (Signed)
  Anesthesia Post-op Note  Patient: Victoria Bowman  Procedure(s) Performed: * No procedures listed *  Patient Location: A-ICU  Anesthesia Type:Epidural  Level of Consciousness: awake  Airway and Oxygen Therapy: Patient Spontanous Breathing  Post-op Pain: none  Post-op Assessment: Patient's Cardiovascular Status Stable, Respiratory Function Stable, Patent Airway, No signs of Nausea or vomiting, Adequate PO intake, Pain level controlled, No headache, No backache, No residual numbness and No residual motor weakness  Post-op Vital Signs: Reviewed and stable  Complications: No apparent anesthesia complications

## 2013-02-01 NOTE — Lactation Note (Signed)
This note was copied from the chart of Victoria Nadya Agro. Lactation Consultation Note    Follow up consult with this mom and baby.now 19 hours post partum. I assisted mom with latching baby in football hold, using her breast feeding pillow. The baby latched with a few suckles, but generally very sleepy. I the hand expressed 5 mls from mom's breast very easily, and spoon fed this to the baby. Haven lapped colostrum with extended tongue, and tolerated feeding well. Mom left holding baby skin to skin. Cue based feeding, skin to skin, and cluster feeding reviewed with mom. Mom knows to call for questions/concerns.  Patient Name: Victoria Bowman [id number] Date: 02/01/2013 Reason for consult: Follow-up assessment   Maternal Data    Feeding Feeding Type: Breast Milk Feeding method: Spoon Length of feed: 10 min  LATCH Score/Interventions Latch: Too sleepy or reluctant, no latch achieved, no sucking elicited. Intervention(s): Skin to skin;Teach feeding cues;Waking techniques Intervention(s): Adjust position;Assist with latch  Audible Swallowing: None Intervention(s): Skin to skin;Hand expression Intervention(s): Skin to skin  Type of Nipple: Everted at rest and after stimulation  Comfort (Breast/Nipple): Soft / non-tender     Hold (Positioning): Assistance needed to correctly position infant at breast and maintain latch. Intervention(s): Breastfeeding basics reviewed;Support Pillows;Position options;Skin to skin  LATCH Score: 5  Lactation Tools Discussed/Used     Consult Status Consult Status: Follow-up Date: 02/02/13 Follow-up type: In-patient    Alfred Levins 02/01/2013, 12:36 PM

## 2013-02-01 NOTE — Progress Notes (Signed)
UR chart review completed.  

## 2013-02-01 NOTE — Progress Notes (Signed)
Called Margretta Sidle RN to give report for pt transfer to rm #[id number] - RN unavailable at this time - will call back.

## 2013-02-01 NOTE — Progress Notes (Signed)
Pt transferred to [facility] Rm #[id number]

## 2013-02-01 NOTE — Progress Notes (Signed)
Post Partum Day one Subjective: voiding and tolerating PO  Objective: Blood pressure 104/64, pulse 94, temperature 97.6 F (36.4 C), temperature source Oral, resp. rate 18, height 5\' 2"  (1.575 m), weight 136 lb 14.4 oz (62.097 kg), last menstrual period 05/05/2012, SpO2 98.00%, unknown if currently breastfeeding.  Physical Exam:  General: alert Lochia: appropriate Uterine Fundus: firm Incision: healing well DVT Evaluation: No evidence of DVT seen on physical exam.   Recent Labs  01/31/13 0300 02/01/13 0530  HGB 10.8* 8.5*  HCT 31.8* 25.0*    Assessment/Plan: Plan for discharge tomorrow.  Will discontinue magnesium today.  Good diuresis.  Labs noted mild elevation of lft will repeat tomorrow.  May d/c home in am.   LOS: 1 day   Yamilex Borgwardt S 02/01/2013, 8:10 AM

## 2013-02-02 LAB — CBC
Hemoglobin: 8.3 g/dL — ABNORMAL LOW (ref 12.0–15.0)
MCV: 92.6 fL (ref 78.0–100.0)
Platelets: 226 10*3/uL (ref 150–400)
RBC: 2.71 MIL/uL — ABNORMAL LOW (ref 3.87–5.11)
WBC: 12.2 10*3/uL — ABNORMAL HIGH (ref 4.0–10.5)

## 2013-02-02 LAB — BASIC METABOLIC PANEL
CO2: 24 mEq/L (ref 19–32)
Chloride: 105 mEq/L (ref 96–112)
Creatinine, Ser: 0.59 mg/dL (ref 0.50–1.10)

## 2013-02-02 NOTE — Discharge Summary (Signed)
Obstetric Discharge Summary Reason for Admission: onset of labor Prenatal Procedures: none Intrapartum Procedures: spontaneous vaginal delivery Postpartum Procedures: none Complications-Operative and Postpartum: 3rd degree perineal laceration Hemoglobin  Date Value Range Status  02/02/2013 8.3* 12.0 - 15.0 g/dL Final     HCT  Date Value Range Status  02/02/2013 25.1* 36.0 - 46.0 % Final    Physical Exam:  General: alert and cooperative Lochia: appropriate Uterine Fundus: firm Incision: n/a DVT Evaluation: No evidence of DVT seen on physical exam.  Discharge Diagnoses: Term Pregnancy-delivered  Discharge Information: Date: 02/02/2013 Activity: pelvic rest Diet: routine Medications: PNV and Ibuprofen Condition: stable Instructions: refer to practice specific booklet Discharge to: home Follow-up Information   Schedule an appointment as soon as possible for a visit in 5 weeks to follow up.      Newborn Data: Live born female  Birth Weight: 7 lb 5.2 oz (3323 g) APGAR: 8, 9  Home with mother.  Philamena Kramar 02/02/2013, 9:37 AM

## 2013-06-16 ENCOUNTER — Other Ambulatory Visit: Payer: Self-pay

## 2014-05-26 ENCOUNTER — Other Ambulatory Visit: Payer: Self-pay

## 2014-06-12 ENCOUNTER — Encounter (HOSPITAL_COMMUNITY): Payer: Self-pay

## 2016-05-07 ENCOUNTER — Other Ambulatory Visit: Payer: Self-pay | Admitting: Obstetrics and Gynecology

## 2016-05-07 DIAGNOSIS — R928 Other abnormal and inconclusive findings on diagnostic imaging of breast: Secondary | ICD-10-CM

## 2016-05-09 ENCOUNTER — Ambulatory Visit
Admission: RE | Admit: 2016-05-09 | Discharge: 2016-05-09 | Disposition: A | Discharge status: Home / Self Care | Payer: BLUE CROSS/BLUE SHIELD | Source: Ambulatory Visit | Attending: Obstetrics and Gynecology | Admitting: Obstetrics and Gynecology

## 2016-05-09 DIAGNOSIS — R928 Other abnormal and inconclusive findings on diagnostic imaging of breast: Secondary | ICD-10-CM

## 2016-05-16 ENCOUNTER — Other Ambulatory Visit: Payer: Self-pay | Admitting: Obstetrics and Gynecology

## 2016-05-16 DIAGNOSIS — N63 Unspecified lump in unspecified breast: Secondary | ICD-10-CM

## 2016-06-08 ENCOUNTER — Encounter: Payer: Self-pay | Admitting: Radiology

## 2016-06-08 ENCOUNTER — Ambulatory Visit
Admission: RE | Admit: 2016-06-08 | Discharge: 2016-06-08 | Disposition: A | Discharge status: Home / Self Care | Payer: BLUE CROSS/BLUE SHIELD | Source: Ambulatory Visit | Attending: Obstetrics and Gynecology | Admitting: Obstetrics and Gynecology

## 2016-06-08 DIAGNOSIS — N63 Unspecified lump in unspecified breast: Secondary | ICD-10-CM

## 2016-06-08 MED ORDER — GADOBENATE DIMEGLUMINE 529 MG/ML IV SOLN
12.0000 mL | Freq: Once | INTRAVENOUS | Status: AC | PRN
  Administered 2016-06-08: 12 mL via INTRAVENOUS

## 2016-06-12 ENCOUNTER — Other Ambulatory Visit: Payer: Self-pay | Admitting: Obstetrics and Gynecology

## 2016-06-12 DIAGNOSIS — R928 Other abnormal and inconclusive findings on diagnostic imaging of breast: Secondary | ICD-10-CM

## 2016-06-20 ENCOUNTER — Ambulatory Visit
Admission: RE | Admit: 2016-06-20 | Discharge: 2016-06-20 | Disposition: A | Discharge status: Home / Self Care | Payer: BLUE CROSS/BLUE SHIELD | Source: Ambulatory Visit | Attending: Obstetrics and Gynecology | Admitting: Obstetrics and Gynecology

## 2016-06-20 DIAGNOSIS — R928 Other abnormal and inconclusive findings on diagnostic imaging of breast: Secondary | ICD-10-CM

## 2016-06-27 ENCOUNTER — Ambulatory Visit
Admission: RE | Admit: 2016-06-27 | Discharge: 2016-06-27 | Disposition: A | Discharge status: Home / Self Care | Payer: BLUE CROSS/BLUE SHIELD | Source: Ambulatory Visit | Attending: Obstetrics and Gynecology | Admitting: Obstetrics and Gynecology

## 2016-06-27 DIAGNOSIS — R928 Other abnormal and inconclusive findings on diagnostic imaging of breast: Secondary | ICD-10-CM

## 2016-06-27 MED ORDER — GADOBENATE DIMEGLUMINE 529 MG/ML IV SOLN
12.0000 mL | Freq: Once | INTRAVENOUS | Status: AC | PRN
  Administered 2016-06-27: 12 mL via INTRAVENOUS

## 2016-06-30 ENCOUNTER — Other Ambulatory Visit: Payer: Self-pay | Admitting: Obstetrics and Gynecology

## 2016-06-30 DIAGNOSIS — R9389 Abnormal findings on diagnostic imaging of other specified body structures: Secondary | ICD-10-CM

## 2016-07-07 ENCOUNTER — Ambulatory Visit
Admission: RE | Admit: 2016-07-07 | Discharge: 2016-07-07 | Disposition: A | Discharge status: Home / Self Care | Payer: BLUE CROSS/BLUE SHIELD | Source: Ambulatory Visit | Attending: Obstetrics and Gynecology | Admitting: Obstetrics and Gynecology

## 2016-07-07 DIAGNOSIS — R9389 Abnormal findings on diagnostic imaging of other specified body structures: Secondary | ICD-10-CM

## 2018-03-19 IMAGING — MR MR BILATERAL BREAST WITHOUT AND WITH CONTRAST
8 of 12 series · 30 of 48 positions shown · IV contrast (12ml Multihance)
Comparison: Previous exams including recent diagnostic mammogram
dated 05/09/2016 and screening mammogram dated 05/01/2016.

CLINICAL DATA: Recent diagnostic mammogram/ultrasound report of
05/09/2016 described questionable areas of distortion within each
breast.

Patient with family history of breast cancer and lifetime breast
cancer risk assessment calculation measuring greater than 20%.
LABS:  Not applicable
EXAM:
BILATERAL BREAST MRI WITH AND WITHOUT CONTRAST
TECHNIQUE: Multiplanar, multisequence MR images of both breasts were obtained
prior to and following the intravenous administration of 12 ml of
MultiHance.

[Series 2: t2_tirm_tra ipat (a-p) · axial · 3.0mm · 0.70mm/px · 1 of 55 slices shown]
[im 1/55]
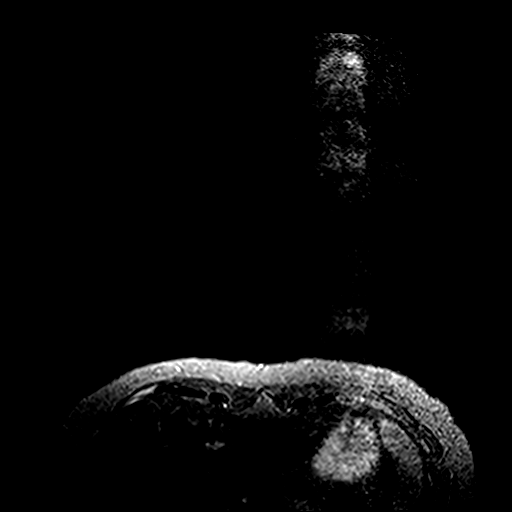

[Series 3: fl3d pre-cm no · axial · non-contrast · 1.2mm · 0.94mm/px · z∈[-90,+82]mm · 5 of 144 slices shown]
[im 1/144]
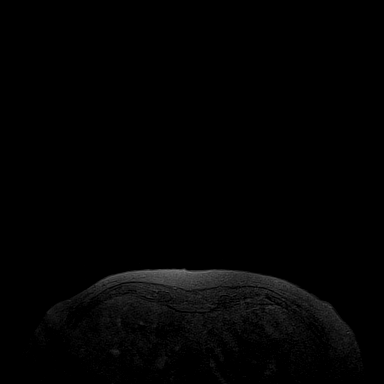
[im 36/144]
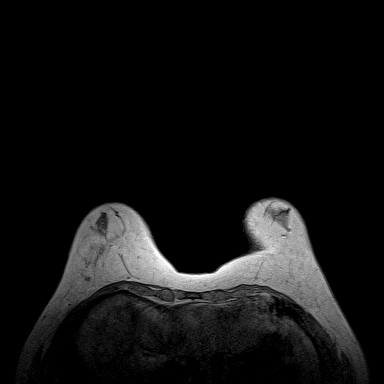
[im 72/144]
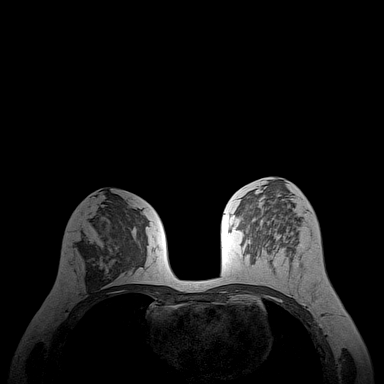
[im 108/144]
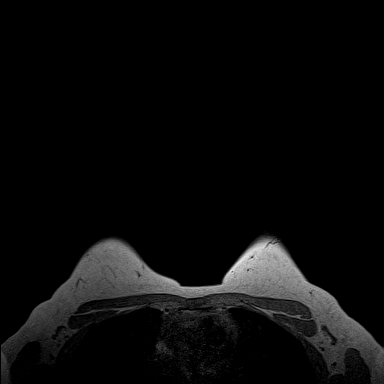
[im 144/144]
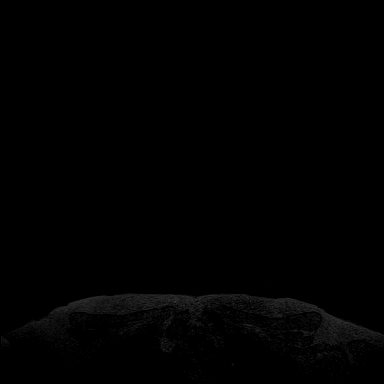

[Series 5: fl3d pre-cm · axial · non-contrast · 1.2mm · 0.94mm/px · z∈[-90,+82]mm · 5 of 144 slices shown]
[im 1/144]
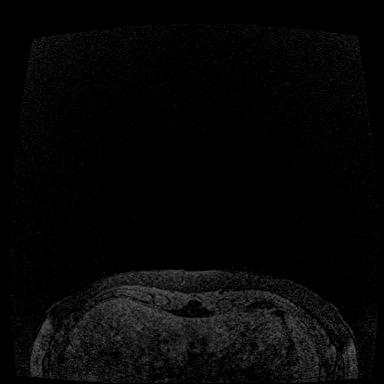
[im 36/144]
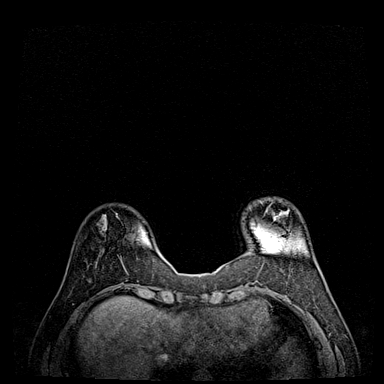
[im 72/144]
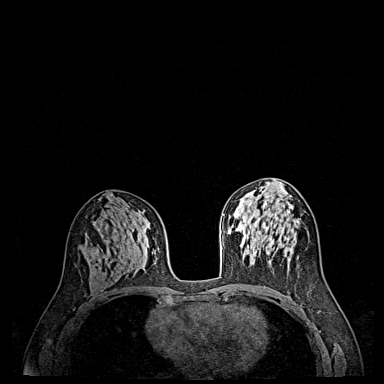
[im 108/144]
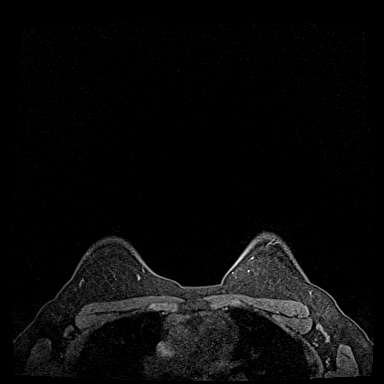
[im 144/144]
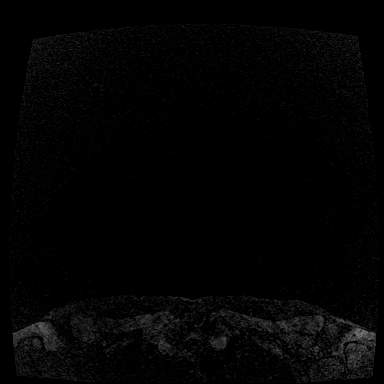

[Series 6: fl3d post-cm 20 · axial · 1.2mm · 0.94mm/px · z∈[-90,+82]mm · 5 of 144 slices shown (1 of 3)]
[im 1/144]
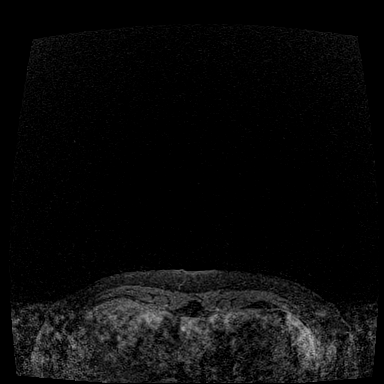
[im 36/144]
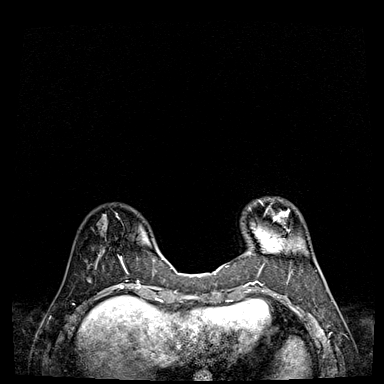
[im 72/144]
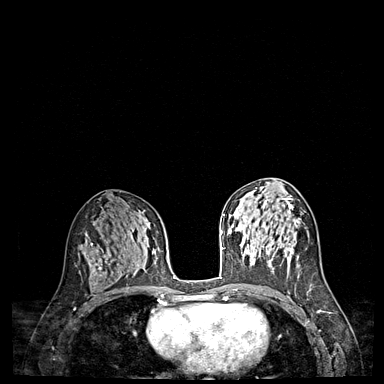
[im 108/144]
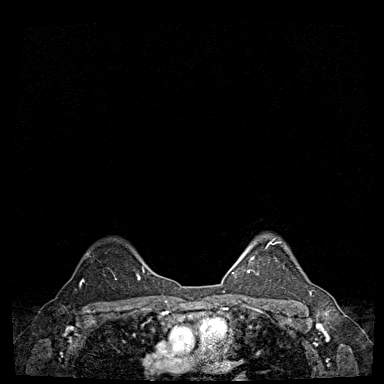
[im 144/144]
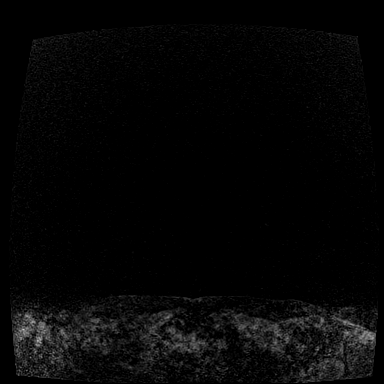

[Series 7: fl3d post-cm 20 · axial · 1.2mm · 0.94mm/px · z∈[-90,+82]mm · 5 of 144 slices shown (2 of 3)]
[im 1/144]
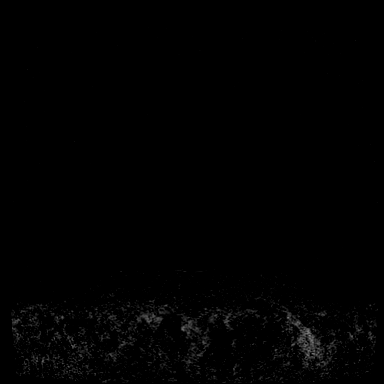
[im 36/144]
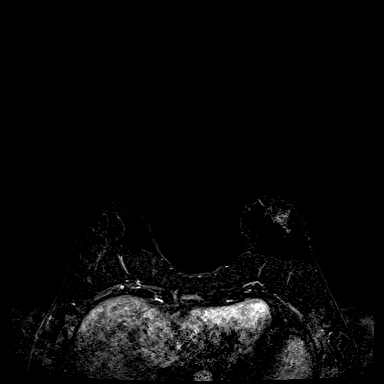
[im 72/144]
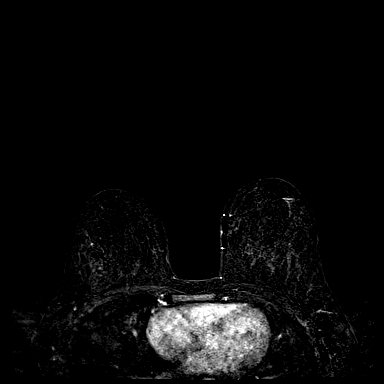
[im 108/144]
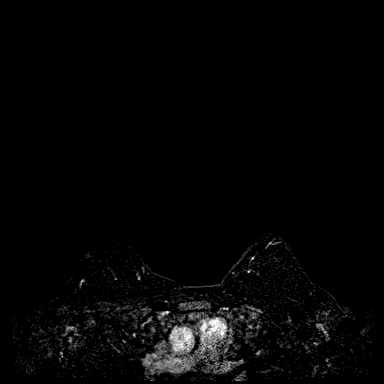
[im 144/144]
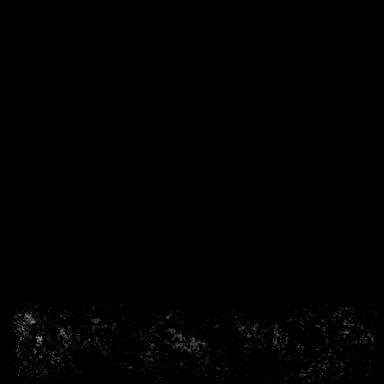

[Series 8: fl3d post-cm 20 · axial · 172.8mm · 0.94mm/px · 1 of 1 slices shown (3 of 3)]
[im 1/1]
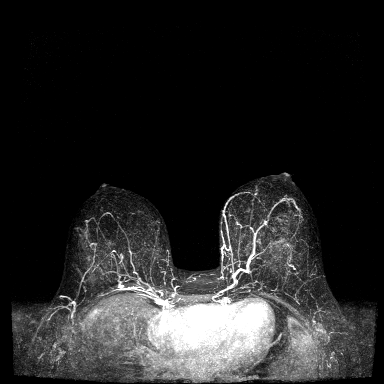

[Series 9: fl3d post-cm 3min · axial · 1.2mm · 0.94mm/px · z∈[-90,+82]mm · 6 of 144 slices shown]
[im 1/144]
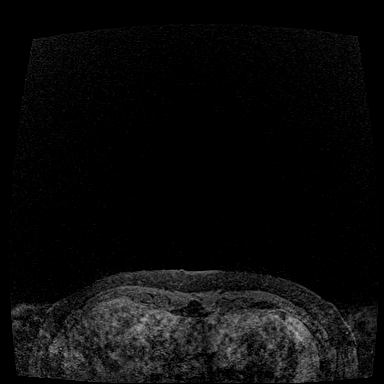
[im 29/144]
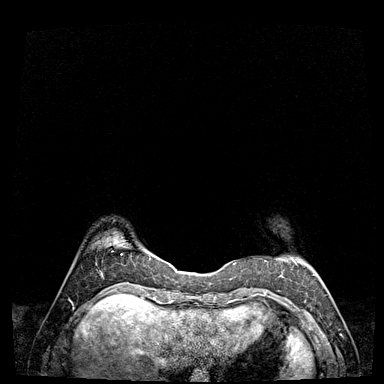
[im 58/144]
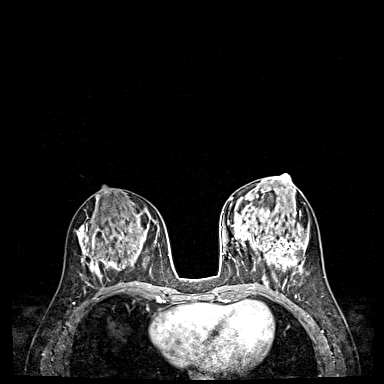
[im 86/144]
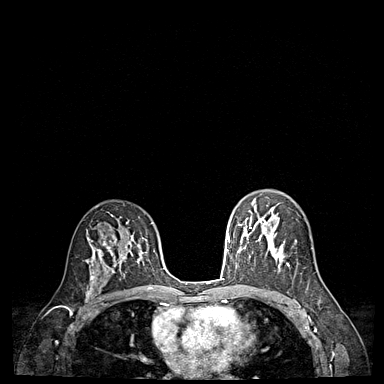
[im 115/144]
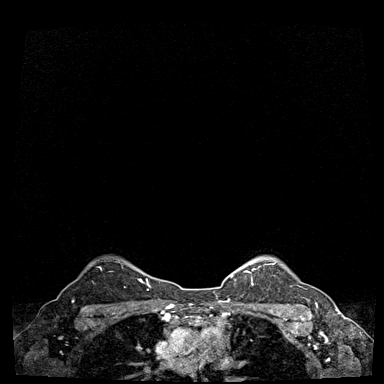
[im 144/144]
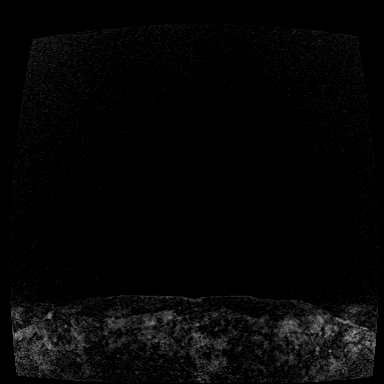

[Series 10: fl3d post-cm 3min_sub · axial · 1.2mm · 0.94mm/px · z∈[-90,-56]mm · 2 of 144 slices shown]
[im 1/144]
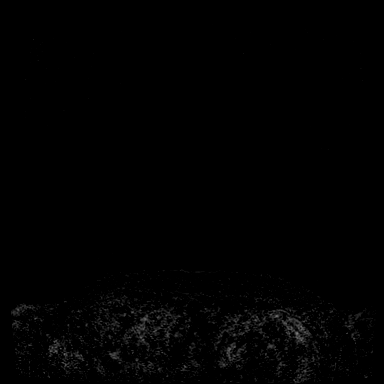
[im 29/144]
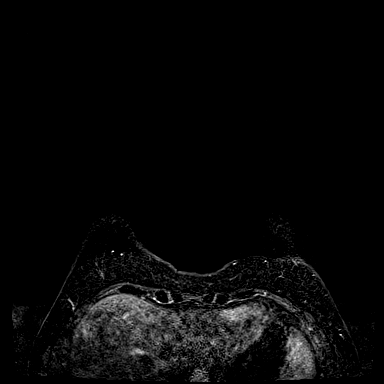

[30 of 48 positions shown; findings below may reference images not displayed]

THREE-DIMENSIONAL MR IMAGE RENDERING ON INDEPENDENT WORKSTATION:

Three-dimensional MR images were rendered by post-processing of the
original MR data on an independent workstation. The
three-dimensional MR images were interpreted, and findings are
reported in the following complete MRI report for this study. Three
dimensional images were evaluated at the independent [employer]
workstation
FINDINGS: Breast composition: c. Heterogeneous fibroglandular tissue.

Background parenchymal enhancement: Moderate.

Right breast: There is an irregular mass within the lower outer
quadrant of the right breast, at posterior depth, measuring 1.3 x
0.9 x 1 cm, demonstrating persistent enhancement kinetics, a
possible correlate for the mammographic finding (series 10, image
106).

No additional mass, non mass enhancement or secondary signs of
malignancy within the right breast.

Incidental note is made of an enhancing lesion at the skin surface
of the upper-outer right breast, measuring 1.1 cm in length,
suspected sebaceous cyst or mole (series 10, image 34).

Left breast: There is focal non mass enhancement within the outer
left breast, 3-4 o'clock axis region, at posterior depth, measuring
approximately 2.8 x 2.4 x 2.5 cm, with associated architectural
distortion demonstrating mixed enhancement kinetics but mostly
persistent, a possible correlate for the mammographic finding
(series 10, images 87 through 91) (associated distortion best seen
on series 6, images 81 through 87 ; and series 3, image 84).

No additional mass, non mass enhancement or secondary signs of
malignancy within the left breast.

Lymph nodes: No abnormal appearing lymph nodes.

Ancillary findings:  None.
IMPRESSION: 1. Asymmetric non mass enhancement within the outer LEFT breast, 3-4
o'clock axis region, at posterior depth, measuring 2.8 x 2.4 x
cm, with associated architectural distortion, a probable correlate
for the mammographic finding. This is a suspicious finding for which
MRI guided biopsy is recommended.
2. Irregular mass within the lower outer quadrant of the RIGHT
breast, at posterior depth, measuring 1.3 x 0.9 x 1 cm, a possible
correlate for the mammographic finding. This is also a suspicious
finding for which MRI guided biopsy is recommended.
3. Enhancing lesion at the skin surface of the upper-outer right
breast, measuring 1.1 cm in length, without mammographic correlate,
suspected sebaceous cyst or mole. Recommend physical exam
correlation. If no correlating finding on physical examination,
recommend targeted right breast ultrasound of this area.

RECOMMENDATION:
1. Bilateral MRI-guided breast biopsies, as detailed above.
2. Physical exam correlation for the enhancing lesion at the skin
surface of the upper-outer right breast. Recommend targeted right
breast ultrasound if no physical exam findings to explain this
enhancing lesion.

BI-RADS CATEGORY  4: Suspicious.

## 2019-12-02 ENCOUNTER — Ambulatory Visit: Payer: Self-pay | Attending: Internal Medicine

## 2019-12-02 DIAGNOSIS — Z23 Encounter for immunization: Secondary | ICD-10-CM

## 2019-12-02 NOTE — Progress Notes (Signed)
   Covid-19 Vaccination Clinic  Name:  Victoria Bowman    MRN: [id number] DOB: May 03, 1986  12/02/2019  [name] was observed post Covid-19 immunization for 15 minutes without incident. She was provided with Vaccine Information Sheet and instruction to access the V-Safe system.   [name] was instructed to call 911 with any severe reactions post vaccine: Marland Kitchen Difficulty breathing  . Swelling of face and throat  . A fast heartbeat  . A bad rash all over body  . Dizziness and weakness   Immunizations Administered    Name Date Dose VIS Date Route   Pfizer COVID-19 Vaccine 12/02/2019 12:17 PM 0.3 mL 10/05/2018 Intramuscular   Manufacturer: [employer], [employer]   Lot: [id number]   NDC: [address]-5

## 2019-12-26 ENCOUNTER — Ambulatory Visit: Payer: Self-pay | Attending: Internal Medicine

## 2019-12-26 DIAGNOSIS — Z23 Encounter for immunization: Secondary | ICD-10-CM

## 2019-12-26 NOTE — Progress Notes (Signed)
   Covid-19 Vaccination Clinic  Name:  Victoria Bowman    MRN: [id number] DOB: 10/19/1985  12/26/2019  [name] was observed post Covid-19 immunization for 15 minutes without incident. She was provided with Vaccine Information Sheet and instruction to access the V-Safe system.   [name] was instructed to call 911 with any severe reactions post vaccine: Marland Kitchen Difficulty breathing  . Swelling of face and throat  . A fast heartbeat  . A bad rash all over body  . Dizziness and weakness   Immunizations Administered    Name Date Dose VIS Date Route   Pfizer COVID-19 Vaccine 12/26/2019  1:00 PM 0.3 mL 10/05/2018 Intramuscular   Manufacturer: [employer], [employer]   Lot: [id number]   NDC: [address]-3

## 2021-08-13 ENCOUNTER — Other Ambulatory Visit: Payer: Self-pay | Admitting: Obstetrics and Gynecology

## 2021-08-13 DIAGNOSIS — R928 Other abnormal and inconclusive findings on diagnostic imaging of breast: Secondary | ICD-10-CM

## 2021-09-02 ENCOUNTER — Ambulatory Visit
Admission: RE | Admit: 2021-09-02 | Discharge: 2021-09-02 | Disposition: A | Discharge status: Home / Self Care | Payer: BC Managed Care – PPO | Source: Ambulatory Visit | Attending: Obstetrics and Gynecology | Admitting: Obstetrics and Gynecology

## 2021-09-02 ENCOUNTER — Ambulatory Visit
Admission: RE | Admit: 2021-09-02 | Discharge: 2021-09-02 | Disposition: A | Discharge status: Home / Self Care | Payer: Self-pay | Source: Ambulatory Visit | Attending: Obstetrics and Gynecology | Admitting: Obstetrics and Gynecology

## 2021-09-02 ENCOUNTER — Other Ambulatory Visit: Payer: Self-pay | Admitting: Obstetrics and Gynecology

## 2021-09-02 DIAGNOSIS — N6310 Unspecified lump in the right breast, unspecified quadrant: Secondary | ICD-10-CM

## 2021-09-02 DIAGNOSIS — R928 Other abnormal and inconclusive findings on diagnostic imaging of breast: Secondary | ICD-10-CM

## 2021-09-02 DIAGNOSIS — N6002 Solitary cyst of left breast: Secondary | ICD-10-CM

## 2021-09-19 ENCOUNTER — Other Ambulatory Visit: Payer: Self-pay

## 2022-03-04 ENCOUNTER — Other Ambulatory Visit: Payer: BC Managed Care – PPO

## 2022-07-08 ENCOUNTER — Other Ambulatory Visit: Payer: Self-pay | Admitting: Obstetrics and Gynecology

## 2022-07-08 DIAGNOSIS — R928 Other abnormal and inconclusive findings on diagnostic imaging of breast: Secondary | ICD-10-CM

## 2022-07-08 DIAGNOSIS — N6002 Solitary cyst of left breast: Secondary | ICD-10-CM

## 2022-09-03 ENCOUNTER — Ambulatory Visit
Admission: RE | Admit: 2022-09-03 | Discharge: 2022-09-03 | Disposition: A | Discharge status: Home / Self Care | Payer: BC Managed Care – PPO | Source: Ambulatory Visit | Attending: Obstetrics and Gynecology | Admitting: Obstetrics and Gynecology

## 2022-09-03 DIAGNOSIS — N6002 Solitary cyst of left breast: Secondary | ICD-10-CM

## 2022-09-03 DIAGNOSIS — R928 Other abnormal and inconclusive findings on diagnostic imaging of breast: Secondary | ICD-10-CM

## 2023-03-26 ENCOUNTER — Other Ambulatory Visit (HOSPITAL_COMMUNITY): Payer: Self-pay

## 2023-03-26 MED ORDER — TRETINOIN 0.05 % EX CREA
TOPICAL_CREAM | CUTANEOUS | 3 refills | Status: AC
  Filled 2023-03-26 – 2023-04-28 (×3): qty 20, 30d supply, fill #0
  Filled 2023-08-11: qty 20, 30d supply, fill #1

## 2023-04-02 ENCOUNTER — Other Ambulatory Visit (HOSPITAL_COMMUNITY): Payer: Self-pay

## 2023-04-07 ENCOUNTER — Other Ambulatory Visit (HOSPITAL_COMMUNITY): Payer: Self-pay

## 2023-04-20 ENCOUNTER — Other Ambulatory Visit (HOSPITAL_COMMUNITY): Payer: Self-pay

## 2023-04-28 ENCOUNTER — Other Ambulatory Visit (HOSPITAL_COMMUNITY): Payer: Self-pay

## 2023-07-23 ENCOUNTER — Other Ambulatory Visit: Payer: Self-pay | Admitting: Obstetrics and Gynecology

## 2023-07-23 DIAGNOSIS — N6320 Unspecified lump in the left breast, unspecified quadrant: Secondary | ICD-10-CM

## 2023-07-23 DIAGNOSIS — Z803 Family history of malignant neoplasm of breast: Secondary | ICD-10-CM

## 2023-08-10 ENCOUNTER — Other Ambulatory Visit (HOSPITAL_COMMUNITY): Payer: Self-pay

## 2023-08-10 MED ORDER — AMOXICILLIN-POT CLAVULANATE 500-125 MG PO TABS
ORAL_TABLET | ORAL | 0 refills | Status: DC
  Filled 2023-08-10: qty 4, 2d supply, fill #0

## 2023-08-11 ENCOUNTER — Other Ambulatory Visit (HOSPITAL_COMMUNITY): Payer: Self-pay

## 2023-08-11 ENCOUNTER — Other Ambulatory Visit: Payer: Self-pay

## 2023-09-07 ENCOUNTER — Ambulatory Visit
Admission: RE | Admit: 2023-09-07 | Discharge: 2023-09-07 | Disposition: A | Discharge status: Home / Self Care | Payer: BC Managed Care – PPO | Source: Ambulatory Visit | Attending: Obstetrics and Gynecology | Admitting: Obstetrics and Gynecology

## 2023-09-07 DIAGNOSIS — N6320 Unspecified lump in the left breast, unspecified quadrant: Secondary | ICD-10-CM

## 2024-03-08 ENCOUNTER — Ambulatory Visit
Admission: RE | Admit: 2024-03-08 | Discharge: 2024-03-08 | Disposition: A | Discharge status: Home / Self Care | Payer: BC Managed Care – PPO | Source: Ambulatory Visit | Attending: Obstetrics and Gynecology | Admitting: Obstetrics and Gynecology

## 2024-03-08 DIAGNOSIS — Z803 Family history of malignant neoplasm of breast: Secondary | ICD-10-CM

## 2024-03-08 MED ORDER — GADOPICLENOL 0.5 MMOL/ML IV SOLN
7.0000 mL | Freq: Once | INTRAVENOUS | Status: AC | PRN
  Administered 2024-03-08: 7 mL via INTRAVENOUS

## 2024-03-09 ENCOUNTER — Other Ambulatory Visit (HOSPITAL_COMMUNITY): Payer: Self-pay

## 2024-03-09 ENCOUNTER — Other Ambulatory Visit: Payer: Self-pay | Admitting: Obstetrics and Gynecology

## 2024-03-09 DIAGNOSIS — R928 Other abnormal and inconclusive findings on diagnostic imaging of breast: Secondary | ICD-10-CM

## 2024-03-09 MED ORDER — ALPRAZOLAM 0.5 MG PO TABS
0.5000 mg | ORAL_TABLET | Freq: Every day | ORAL | 0 refills | Status: AC
  Filled 2024-03-09: qty 2, 1d supply, fill #0

## 2024-03-12 ENCOUNTER — Other Ambulatory Visit (HOSPITAL_COMMUNITY): Payer: Self-pay

## 2024-03-14 ENCOUNTER — Ambulatory Visit
Admission: RE | Admit: 2024-03-14 | Discharge: 2024-03-14 | Disposition: A | Discharge status: Home / Self Care | Source: Ambulatory Visit | Attending: Obstetrics and Gynecology | Admitting: Obstetrics and Gynecology

## 2024-03-14 ENCOUNTER — Other Ambulatory Visit (HOSPITAL_COMMUNITY): Payer: Self-pay | Admitting: Diagnostic Radiology

## 2024-03-14 DIAGNOSIS — R928 Other abnormal and inconclusive findings on diagnostic imaging of breast: Secondary | ICD-10-CM

## 2024-03-14 MED ORDER — GADOPICLENOL 0.5 MMOL/ML IV SOLN
6.5000 mL | Freq: Once | INTRAVENOUS | Status: AC | PRN
  Administered 2024-03-14: 6.5 mL via INTRAVENOUS

## 2024-03-15 LAB — SURGICAL PATHOLOGY

## 2024-04-14 ENCOUNTER — Other Ambulatory Visit (HOSPITAL_COMMUNITY): Payer: Self-pay

## 2024-07-18 ENCOUNTER — Other Ambulatory Visit (HOSPITAL_COMMUNITY): Payer: Self-pay

## 2024-07-18 ENCOUNTER — Other Ambulatory Visit: Payer: Self-pay

## 2024-07-18 MED ORDER — AMOXICILLIN-POT CLAVULANATE 875-125 MG PO TABS
1.0000 | ORAL_TABLET | Freq: Two times a day (BID) | ORAL | 0 refills | Status: AC
  Filled 2024-07-18: qty 20, 10d supply, fill #0
  Filled 2024-07-18: qty 14, 7d supply, fill #0
  Filled 2024-07-18: qty 20, 10d supply, fill #0
  Filled 2024-07-18: qty 6, 3d supply, fill #0
